# Patient Record
Sex: Female | Born: 1970
Health system: Southern US, Community
[De-identification: ages and names within clinical notes are randomized; demographics above are authoritative.]

## PROBLEM LIST (undated history)

## (undated) DIAGNOSIS — N390 Urinary tract infection, site not specified: Secondary | ICD-10-CM

## (undated) DIAGNOSIS — J40 Bronchitis, not specified as acute or chronic: Secondary | ICD-10-CM

## (undated) DIAGNOSIS — F419 Anxiety disorder, unspecified: Secondary | ICD-10-CM

## (undated) DIAGNOSIS — J302 Other seasonal allergic rhinitis: Secondary | ICD-10-CM

## (undated) DIAGNOSIS — E785 Hyperlipidemia, unspecified: Secondary | ICD-10-CM

## (undated) DIAGNOSIS — D649 Anemia, unspecified: Secondary | ICD-10-CM

## (undated) HISTORY — PX: COLONOSCOPY: SHX174

## (undated) HISTORY — PX: WISDOM TOOTH EXTRACTION: SHX21

## (undated) HISTORY — PX: UPPER GI ENDOSCOPY: SHX6162

## (undated) HISTORY — PX: DILATION AND CURETTAGE OF UTERUS: SHX78

---

## 1997-11-19 ENCOUNTER — Other Ambulatory Visit: Admission: RE | Admit: 1997-11-19 | Discharge: 1997-11-19 | Payer: Self-pay | Admitting: Obstetrics and Gynecology

## 1998-04-30 ENCOUNTER — Other Ambulatory Visit: Admission: RE | Admit: 1998-04-30 | Discharge: 1998-04-30 | Payer: Self-pay | Admitting: Obstetrics and Gynecology

## 1998-12-10 ENCOUNTER — Other Ambulatory Visit: Admission: RE | Admit: 1998-12-10 | Discharge: 1998-12-10 | Payer: Self-pay | Admitting: Obstetrics and Gynecology

## 1999-05-06 ENCOUNTER — Other Ambulatory Visit: Admission: RE | Admit: 1999-05-06 | Discharge: 1999-05-06 | Payer: Self-pay | Admitting: Obstetrics and Gynecology

## 2000-05-11 ENCOUNTER — Other Ambulatory Visit: Admission: RE | Admit: 2000-05-11 | Discharge: 2000-05-11 | Payer: Self-pay | Admitting: Obstetrics and Gynecology

## 2000-05-16 ENCOUNTER — Encounter: Payer: Self-pay | Admitting: Obstetrics and Gynecology

## 2000-05-16 ENCOUNTER — Encounter: Admission: RE | Admit: 2000-05-16 | Discharge: 2000-05-16 | Payer: Self-pay | Admitting: Obstetrics and Gynecology

## 2001-05-25 ENCOUNTER — Other Ambulatory Visit: Admission: RE | Admit: 2001-05-25 | Discharge: 2001-05-25 | Payer: Self-pay | Admitting: Obstetrics and Gynecology

## 2001-05-25 ENCOUNTER — Other Ambulatory Visit: Admission: RE | Admit: 2001-05-25 | Discharge: 2001-05-25 | Payer: Self-pay | Admitting: Obstetrics & Gynecology

## 2002-01-05 ENCOUNTER — Inpatient Hospital Stay (HOSPITAL_COMMUNITY): Admission: AD | Admit: 2002-01-05 | Discharge: 2002-01-09 | Payer: Self-pay | Admitting: Obstetrics & Gynecology

## 2002-01-06 ENCOUNTER — Encounter (INDEPENDENT_AMBULATORY_CARE_PROVIDER_SITE_OTHER): Payer: Self-pay | Admitting: *Deleted

## 2002-02-07 ENCOUNTER — Other Ambulatory Visit: Admission: RE | Admit: 2002-02-07 | Discharge: 2002-02-07 | Payer: Self-pay | Admitting: Obstetrics & Gynecology

## 2002-10-23 ENCOUNTER — Encounter: Payer: Self-pay | Admitting: Obstetrics and Gynecology

## 2002-10-23 ENCOUNTER — Encounter: Admission: RE | Admit: 2002-10-23 | Discharge: 2002-10-23 | Payer: Self-pay | Admitting: Obstetrics and Gynecology

## 2003-04-08 ENCOUNTER — Ambulatory Visit (HOSPITAL_COMMUNITY): Admission: RE | Admit: 2003-04-08 | Discharge: 2003-04-08 | Payer: Self-pay | Admitting: Obstetrics & Gynecology

## 2003-04-08 ENCOUNTER — Encounter (INDEPENDENT_AMBULATORY_CARE_PROVIDER_SITE_OTHER): Payer: Self-pay | Admitting: Specialist

## 2004-04-08 ENCOUNTER — Inpatient Hospital Stay (HOSPITAL_COMMUNITY): Admission: RE | Admit: 2004-04-08 | Discharge: 2004-04-11 | Payer: Self-pay | Admitting: Obstetrics & Gynecology

## 2006-06-30 ENCOUNTER — Inpatient Hospital Stay (HOSPITAL_COMMUNITY): Admission: RE | Admit: 2006-06-30 | Discharge: 2006-07-03 | Payer: Self-pay | Admitting: Obstetrics & Gynecology

## 2007-02-22 HISTORY — PX: CHOLECYSTECTOMY: SHX55

## 2009-05-12 ENCOUNTER — Encounter: Admission: RE | Admit: 2009-05-12 | Discharge: 2009-05-12 | Payer: Self-pay | Admitting: Obstetrics & Gynecology

## 2010-06-10 ENCOUNTER — Other Ambulatory Visit: Payer: Self-pay | Admitting: Obstetrics & Gynecology

## 2010-06-10 DIAGNOSIS — Z1231 Encounter for screening mammogram for malignant neoplasm of breast: Secondary | ICD-10-CM

## 2010-06-24 ENCOUNTER — Ambulatory Visit
Admission: RE | Admit: 2010-06-24 | Discharge: 2010-06-24 | Disposition: A | Discharge status: Home / Self Care | Payer: BC Managed Care – PPO | Source: Ambulatory Visit | Attending: Obstetrics & Gynecology | Admitting: Obstetrics & Gynecology

## 2010-06-24 DIAGNOSIS — Z1231 Encounter for screening mammogram for malignant neoplasm of breast: Secondary | ICD-10-CM

## 2010-07-09 NOTE — Op Note (Signed)
NAMEALLIENE, KLUGH               ACCOUNT NO.:  [id number]   MEDICAL RECORD NO.:  [id number]          PATIENT TYPE:  INP   LOCATION:  9148                          [facility]ACILITY:  [facility]   PHYSICIAN:  Genia Del, M.D.DATE OF BIRTH:  Mar 20, 1970   DATE OF PROCEDURE:  06/30/2006  DATE OF DISCHARGE:                               OPERATIVE REPORT   PREOPERATIVE DIAGNOSIS:  [redacted] weeks gestation, previous cesarean section.   POSTOPERATIVE DIAGNOSIS:  [redacted] weeks gestation, previous cesarean section.   PROCEDURE:  Repeat low transverse cesarean section.   SURGEON:  Genia Del, M.D.   ANESTHESIOLOGIST:  Quillian Quince, M.D.   ASSISTANT:  Marlinda Mike, C.N.M.   DESCRIPTION OF PROCEDURE:  Under spinal anesthesia, the patient is in  the 15 degrees left decubitus position.  She is prepped with Betadine on  the abdominal, suprapubic, vulvar and vaginal areas.  The bladder is  catheterized and the Foley is left in place. The patient is draped as  usual.  We verified the level of anesthesia which is adequate.  We  infiltrate with 0.25% Marcaine 15 mL at the site of the previous C-  section scar.  We then make a Pfannenstiel incision at that level with  the scalpel.  We open the adipose tissue with the scalpel.  We opened  the aponeurosis transversely with the electrocautery and Mayo scissors.  We separate the recti muscles from the aponeurosis on the midline and we  opened the parietal peritoneum longitudinally with Metzenbaum scissors.  We then put the bladder retractor in place.  We opened the visceral  peritoneum over the lower uterine segment transversely with Metzenbaum  scissors.  We reclined the bladder downward.  We then make a low  transverse hysterotomy with a scalpel.  We extend on each side with  dressing scissors.  The fetus is in cephalic presentation.  Amniotic  fluid is clear.  Birth of a baby boy at 6:35 p.m. The cord is clamped  and cut. The baby was suctioned and given  to the neonatal team.  Apgars  are 9 and 9, the weight is 7 pounds 12 ounces. The placenta is evacuated  spontaneously as the patient is a cord blood donor.  The uterine  revision is done. Pitocin is started in the IV fluid.  A dose of Ancef 1  gram IV is given.  We close the hysterotomy in a first locked running  suture of Vicryl 0, a second layer in a mattress fashion is done with  Vicryl 0, and two figure-of-eight with Vicryl 0 are used in the midline  to complete hemostasis.  Both ovaries and both tubes are normal to  inspection.  We then irrigate and suction the abdominopelvic cavity.  Hemostasis was completed with the electrocautery where necessary.  We  then close the aponeurosis with two half running sutures of Vicryl 0.  We then complete hemostasis at the adipose tissue with the  electrocautery and  reapproximate the skin with staples.  A dry dressing is applied.  The  count of instruments and sponges was complete x2.  The estimated blood  loss was 600 mL.  No complications occurred and the patient was brought  to the recovery room in good stable status.      Genia Del, M.D.  Electronically Signed     ML/MEDQ  D:  06/30/2006  T:  06/30/2006  Job:  [id number]

## 2010-07-09 NOTE — Op Note (Signed)
NAMELAURELLA, [name]                         ACCOUNT NO.:  [id number]   MEDICAL RECORD NO.:  [id number]                   PATIENT TYPE:  INP   LOCATION:  9130                                 [facility]   PHYSICIAN:  Genia Del, M.D.             DATE OF BIRTH:  August 05, 1970   DATE OF PROCEDURE:  01/06/2002  DATE OF DISCHARGE:                                 OPERATIVE REPORT   PREOPERATIVE DIAGNOSES:  1. Forty weeks and four days' gestation.  2. Arrest of descent in second stage.  3. Probable left posterior occiput presentation.  4. Unsuccessful vacuum extraction.   POSTOPERATIVE DIAGNOSES:  1. Forty weeks and four days' gestation.  2. Arrest of descent in second stage.  3. Confirmed posterior occiput presentation.  4. Unsuccessful vacuum extraction.   PROCEDURE:  Urgent primary low transverse cesarean section.   SURGEON:  Genia Del, M.D.   ANESTHESIOLOGIST:  Burnett Corrente, M.D.   DESCRIPTION OF PROCEDURE:  Under continuous spinal, the patient is in a 15  degree left decubitus presentation.  The patient is prepped with Betadine in  the abdominal, suprapubic, and vulvar areas.  The bladder catheter is  already in place, and the patient is draped as usual.  A Pfannenstiel  incision is done with a scalpel.  We then open the aponeurosis transversely  with the Mayo scissors.  The aponeurosis is removed from the recti muscles  on the midline.  We then open the parietal peritoneum with the Metzenbaum  scissors longitudinally.  We open the visceral peritoneum over the lower  uterine segment transversely with the Metzenbaum scissors.  The bladder is  reclined downward.  We open the hysterotomy transversely over the lower  uterine segment with a scalpel and extended on each side with a scissors.  The baby is in cephalic presentation, occiput posterior.  Amniotic fluid is  clear.  Birth of a baby girl at 8:26 a.m.  The cord is clamped and cut.  The  baby is given  to the neonatal team.  Apgars are 8 and 9.  The cord blood is  taken.  The placenta is then evacuated spontaneously.  It is complete, three  vessels.  We proceed with revision of the uterine cavity.  The uterus is  contracting well with Pitocin in the IV fluids.  We then close the  hysterotomy in a first continuous locked plain with 0 Vicryl, a second plain  in a mattress fashion is done with 0 Vicryl.  Hemostasis is adequate.  Both  tubes and ovaries are normal in size and appearance.  We then verify  hemostasis in the recti muscles and on the bladder flap.  It is completed  with the electrocautery.  We irrigate and suction the abdominopelvic cavity.  We then close the aponeurosis in two half-running sutures of 0 Vicryl.  The  count of instruments and sponges is complete x2.  We complete  hemostasis on  the adipose tissue with the electrocautery and reapproximate the skin with  staples.  A dry dressing is applied.  Note that the subcutaneous  infiltration of 0.25% Marcaine 10 cc was done at the beginning of the  intervention.  Then more local was poured in  the abdominopelvic cavity to complete anesthesia.  The estimated blood loss  was 700 cc.  No complication occurred, and the patient was transferred to  recovery room in good status.  She received Ancef 1 g IV after clamping the  cord.  Her blood group is A positive.  She is Rubella-immune.                                               Genia Del, M.D.    ML/MEDQ  D:  01/06/2002  T:  01/08/2002  Job:  [id number]

## 2010-07-09 NOTE — Discharge Summary (Signed)
[name], BRASE               ACCOUNT NO.:  [id number]   MEDICAL RECORD NO.:  [id number]          PATIENT TYPE:  INP   LOCATION:  9148                          [facility]   PHYSICIAN:  Genia Del, M.D.DATE OF BIRTH:  [birth year]   DATE OF ADMISSION:  06/30/2006  DATE OF DISCHARGE:  07/03/2006                               DISCHARGE SUMMARY   The patient came for surgery on Jun 30, 2006.   PREOPERATIVE DIAGNOSIS:  Thirty-nine plus weeks, previous cesarean  section.   POSTOPERATIVE DIAGNOSIS:  Thirty-nine plus weeks, previous cesarean  section.   PROCEDURE:  Repeat low transverse Cesarean section.   SURGEON:  Genia Del, M.D.   ASSISTANT:  Marlinda Mike, C.N.M.   Birth of a baby boy at 6:35, 7 pounds and 12 ounces. The estimated blood  loss was 600 mL. No complications occurred. Her postoperative evaluation  was normal. She was discharged home on postoperative day #3 in good  stable status. Her hemoglobin postoperative was 11.4. She was given  postoperative advice and pain medications. She will follow up at  [facility] in 6 weeks.      Genia Del, M.D.  Electronically Signed     ML/MEDQ  D:  08/15/2006  T:  08/15/2006  Job:  [id number]

## 2010-07-09 NOTE — Op Note (Signed)
[name], [name]               ACCOUNT NO.:  [id number]   MEDICAL RECORD NO.:  [id number]          PATIENT TYPE:  INP   LOCATION:  9199                          [facility]   PHYSICIAN:  Genia Del, M.D.DATE OF BIRTH:  1970-03-11   DATE OF PROCEDURE:  04/08/2004  DATE OF DISCHARGE:                                 OPERATIVE REPORT   PREOPERATIVE DIAGNOSES:  1.  Thirty-nine weeks' gestation.  2.  Previous cesarean section.   POSTOPERATIVE DIAGNOSES:  1.  Thirty-nine weeks' gestation.  2.  Previous cesarean section.   INTERVENTION:  Repeat elective low transverse cesarean section.   SURGEON:  Genia Del, M.D.   ASSISTANT:  Gerri Spore B. Earlene Plater, M.D.   ANESTHESIOLOGIST:  Octaviano Glow. Pamalee Leyden, M.D.   PROCEDURE:  Under spinal anesthesia, the patient is in 15 degree lateral  decubitus position.  She is prepped with Hibiclens on the abdominal,  suprapubic, vulvar and vaginal areas.  The bladder catheter is inserted and  the patient is draped as usual, and infiltration of Marcaine 0.25% 20 mL is  done at the subcutaneous tissue at the level of the previous scar.  We then  proceed with a scalpel to resect the old scar and produce a Pfannenstiel  opening.  We then use the electrocautery cutting mode to open the adipose  tissue and the aponeurosis transversely.  We extend the transverse incision  at the aponeurosis with Mayo scissors on each side.  We then free the recti  muscles from the aponeurosis on the midline superiorly and inferiorly.  The  parietal peritoneum is opened longitudinally with Metzenbaum scissors.  We  then insert the bladder retractor.  The visceral peritoneum is opened  transversely over the lower uterine segment with Metzenbaum scissors.  The  bladder is reclined downward.  The bladder retractor is repositioned.  We  then open a low transverse hysterotomy with a scalpel.  We extend on each  side with dressing scissors.  The amniotic fluid is clear.   The fetus is in  cephalic presentation.  Birth of a baby girl at 12:49.  The baby is  suctioned after delivery of the head.  The cord is clamped and cut.  The  baby is given to the neonatal team.  Apgars are 8 and 9.  Weight is 7 pounds  6 ounces.  The cord blood is taken.  The placenta is evacuated spontaneously  and sent to L&D.  The Pitocin is started in the IV fluids.  A dose of Ancef  1 g IV is given.  We then do the uterine revision.  The hysterotomy is  closed in one full plane locked running 0 Vicryl.  A second layer in a  mattress fashion is done, which completes the hysterotomy closure.  Hemostasis is adequate at all levels.  Both ovaries and both tubes are  normal in size and appearance.  The uterus is well-contracted and normal in  size and appearance.  We irrigate and suction the abdominal-pelvic cavity.  Hemostasis is completed with the electrocautery on the recti muscles where  necessary.  We then close  the aponeurosis with two half-running sutures of 0  Vicryl.  Hemostasis is completed at the adipose tissue with the  electrocautery, which is also used to decrease adhesions at  that level.  We then use staples to reapproximate the skin.  A dry dressing  is applied.  The count of sponges and instruments was complete x2.  The  estimated blood loss was 600 mL, no complication occurred, and the patient  was brought to recovery room in good status.      ML/MEDQ  D:  04/08/2004  T:  04/08/2004  Job:  [id number]

## 2010-07-09 NOTE — H&P (Signed)
[name], [name]                         ACCOUNT NO.:  [id number]   MEDICAL RECORD NO.:  [id number]                   PATIENT TYPE:  INP   LOCATION:  9160                                 [facility]   PHYSICIAN:  Genia Del, M.D.             DATE OF BIRTH:  03/13/1970   DATE OF ADMISSION:  01/05/2002  DATE OF DISCHARGE:                                HISTORY & PHYSICAL   HISTORY:  The patient is a 40 year old G2, P0, A1, estimated date of  delivery on 01/01/02 at 40 weeks', 4 days' gestation.  Ultrasound dates  corresponded.   REASON FOR ADMISSION:  Induction for proposed dates.   HISTORY OF PRESENT ILLNESS:  Mild irregular uterine contractions, fetal  movements positive, no fluid leak, no vaginal bleeding, no PIH symptoms.  Last ultrasound at term showed borderline large for gestational age and  polyhydramnios, cephalic presentation.   PAST MEDICAL HISTORY:  Postive for recurrent cystitis and  hypercholesterolemia.   PAST SURGICAL HISTORY:  Positive for kidney surgery and ureters.  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   OBSTETRICAL HISTORY:  In 1991, 5-6 weeks' therapeutic abortion, no  complications.   History of present pregnancy:  First trimester was normal, no vaginal  bleeding, no vomiting.  At __________, a left breast nodule was palpated. It  was stable compared to an ultrasound done 3/02 showing a cystic lesion.  A  repeat ultrasound would be done if the lesion increased in size; otherwise,  it will be followed up postpartum.  Labs in the first trimester showed  hemoglobin 14.1, platelets 381, blood group A positive, antibody negative,  toxo negative, syphilis negative, HBAG negative, HIV negative, Rubella  immune.  Urinalysis:  Trace leukocytes and positive nitrites.  Patient was  treated with Amoxil 500 t.i.d. for 10 days.  Urine culture was positive.  Test of cure was done still showing an infection so the patient was  retreated  with Macrobid this time, 100 mg b.i.d. for 7 days.  In the second  trimester, no complications, triple test was within normal limits.  Hemoglobin 12.8. An ultrasound done at 16+ weeks showed a corresponding  dating. It was done because the uterine height was higher than expected.  Amniotic fluid was within normal limits.  There was a posterior uterine  fibroid measuring 3.4 cm.  Ultrasound revealed anatomy at 20 weeks was  within normal limits, placenta anterior and normal, amniotic fluid within  normal limits, cervix 3.1 cm closed.  Myoma was stable.  In the third  trimester, the 1 hour GTT was normal.  Repeat ultrasound was done at 32+  weeks for suspicion of large for gestational age, the estimated fetal weight  was at the 80th percentile, amniotic fluid at the 85th percentile.  Cervix  was slightly shortened but it remained stable.  Group B strep was negative  at 36 weeks.  Repeat ultrasound  at 34+ weeks showed an estimated fetal  weight at the 32nd percentile and the fluid at 98th percentile.  Fetal  movements remained normal throughout pregnancy, blood pressures remained  normal.   FAMILY HISTORY:  Father deceased - leukemia.  Two maternal aunts with breast  cancer.  Paternal uncle with colon cancer. Mother with chronic hypertension.  Maternal grandfather with MI.   MEDICATIONS:  Prenatal vitamins.   ALLERGIES:  Naprosyn and Aleve.   REVIEW OF SYSTEMS:  HEENT:  Negative.  CARDIOVASCULAR:  Negative.  RESPIRATORY:  Negative.  GENITOURINARY/GASTROINTESTINAL:  Negative.  ENDOCRINE/DERMATOLOGY/NEUROLOGIC:  Negative.   PHYSICAL EXAMINATION:  VITAL SIGNS:  Blood pressure 129/77, pulses 82,  respiratory rate 20, temperature 98.2.  GENERAL:  No apparent distress.  LUNGS:  Clear bilaterally.  CARDIOVASCULAR:  Regular rate and rhythm.  ABDOMEN:  Gravida, cephalic presentation.  PELVIC:  Vaginal exam on admission:  3 cm dilated, 80% efface, vertex -1,  artificial rupture of membranes  done, clear fluids, normal.  Monitoring;  Uterine contractions were mild, about every 4-6 minutes on admission.  Monitoring of fetal heart rate showed baseline around 140 with  accelerations, no decelerations.  It was reactive.   IMPRESSION:  A G2, P0, A1 at 40 weeks', 4 days' gestation, for induction for  post-dates and borderline polyhydramnios and slightly large for gestational  age, fetal well-being reassuring, favorable cervix, group B strep negative.   PLAN:  Admit to labor & delivery, low dose Pitocin, artificial rupture of  membranes, expectant management towards probable vaginal delivery with  monitoring.                                                  Genia Del, M.D.    ML/MEDQ  D:  01/05/2002  T:  01/05/2002  Job:  [id number]

## 2010-07-09 NOTE — Discharge Summary (Signed)
[name], CHAMBERS               ACCOUNT NO.:  [id number]   MEDICAL RECORD NO.:  [id number]          PATIENT TYPE:  INP   LOCATION:  9143                          [facility]   PHYSICIAN:  Genia Del, M.D.DATE OF BIRTH:  Jul 21, 1970   DATE OF ADMISSION:  04/08/2004  DATE OF DISCHARGE:  04/11/2004                                 DISCHARGE SUMMARY   ADMISSION DIAGNOSES:  1.  Thirty-nine weeks gestation.  2.  History of C-section.   DISCHARGE DIAGNOSES:  1.  Thirty-nine weeks gestation.  2.  History of C-section.  3.  Birth of a baby girl.   INTERVENTION:  Elective repeat low transverse C-section on April 08, 2004.  Surgery was done without any complications.   Postop evolution was unremarkable.  Hemoglobin was 11.8 postop.  The patient  was discharged home in good status on postop day #3.  Postop advice was  given.  She was prescribed Percocet and Motrin p.r.n. She will follow up  [facility] in 4 weeks.      ML/MEDQ  D:  06/24/2004  T:  06/24/2004  Job:  [id number]

## 2010-07-09 NOTE — Discharge Summary (Signed)
   NAMEJASPER, [name]                         ACCOUNT NO.:  [id number]   MEDICAL RECORD NO.:  [id number]                   PATIENT TYPE:  INP   LOCATION:  9130                                 [facility]   PHYSICIAN:  Genia Del, M.D.             DATE OF BIRTH:  23-Dec-1970   DATE OF ADMISSION:  01/05/2002  DATE OF DISCHARGE:  01/09/2002                                 DISCHARGE SUMMARY   DATE OF BIRTH:  Jun 27, 1970   ADMISSION DIAGNOSES:  1. 40+ weeks gestation.  2. Borderline polyhydramnia.  3. Large for gestational age.   DISCHARGE DIAGNOSES:  1. 40+ weeks gestation.  2. Borderline polyhydramnia.  3. Large for gestational age.  4. Failure to descend in second stage.  5. Birth of a baby girl by C-section in occiput posterior presentation.   HOSPITAL COURSE:  [name] is a 40 year old Gravida II, Para 0, Ab 1, who  was admitted at 40 weeks and 4 days gestation for induction. She was induced  with Pitocin. Had an epidural in labor. Progressed well until complete  dilatation but then after about three hours of pushing, the baby was  descending minimally to a +3 station. Was in left posterior occipital  presentation. Mild molding in caplet was present. Given a very reassuring  fetal heart rate monitoring and inadequate pelvis, the decision was made to  attempt a vacuum extraction. But after four tractions in the safety zone  with minimal descent, the decision was taken with the patient to proceed to  an urgent C-section. So she had an urgent primary low transverse C-section  on January 06, 2002. A baby girl was born in occiput posterior  presentation. Neonatal service was present in the room Apgar's were 8 and 9.  Placenta was complete. Closure of the hysterotomy was made in two planes.  Estimated blood loss was 700 cc. No complications occurred. The patient  received Ancef operatively. The postoperative progression was normal. The  patient remained stable  and afebrile. Her hemoglobin was 9.7 and hematocrit  was 28 on postoperative day one. She was given iron supplement. She was  discharged on postoperative day three in good status. Postpartum booklet was  given and postoperative advise was reviewed. The patient was prescribed  Tylenol #30 tablets as needed and will continue on her prenatal vitamins as  well as iron supplement. She will follow-up at [facility] office in  four weeks.                                               Genia Del, M.D.    ML/MEDQ  D:  02/06/2002  T:  02/07/2002  Job:  [id number]

## 2010-07-09 NOTE — Op Note (Signed)
[name], [name]                         ACCOUNT NO.:  [id number]   MEDICAL RECORD NO.:  [id number]                   PATIENT TYPE:  AMB   LOCATION:  SDC                                  [facility]   PHYSICIAN:  Genia Del, M.D.             DATE OF BIRTH:  [birth year]   DATE OF PROCEDURE:  04/08/2003  DATE OF DISCHARGE:                                 OPERATIVE REPORT   PREOPERATIVE DIAGNOSIS:  Six plus weeks' gestation, missed abortion.   POSTOPERATIVE DIAGNOSIS:  Six plus weeks' gestation, missed abortion.   INTERVENTION:  Dilatation and evacuation.   SURGEON:  Genia Del, M.D.   ANESTHESIA:  Raul Del, M.D.   DESCRIPTION OF PROCEDURE:  Under MAC analgesia, the patient is in lithotomy  position.  She is prepped with Betadine on the suprapubic, vulvar, and  vaginal areas.  The bladder is catheterized and the patient is draped as  usual.  The vaginal exam reveals a retroverted uterus about seven to eight  weeks in size, mobile, no adnexal mass.  The cervix is closed, no bleeding.  The speculum is inserted.  The anterior lip of the cervix is grasped with a  tenaculum.  The paracervical block is done with a total of 20 mL of  lidocaine 1% at 4 and 8 o'clock.  We then proceed with dilatation with Hegar  dilators up to #35 easily.  We then use a #9 curved curette to suction the  intrauterine cavity.  Products of conception are brought out corresponding  to about seven weeks.  They are sent to pathology.  We then use the sharp  curette to systematically curettage the entire intrauterine cavity, and then  we finish with the suction curette again to empty the cavity of any blood  clots or products of conception.  The uterus contracts well on the  instrument.  Hemostasis is adequate.  All instruments are removed.  The  estimated blood loss was less than 50 mL, no complication occurred, and the  patient was transferred to the recovery room in good status.   Note that her  blood group was A positive.                                               Genia Del, M.D.    ML/MEDQ  D:  04/08/2003  T:  04/08/2003  Job:  [id number]

## 2011-02-22 DIAGNOSIS — J40 Bronchitis, not specified as acute or chronic: Secondary | ICD-10-CM

## 2011-02-22 HISTORY — DX: Bronchitis, not specified as acute or chronic: J40

## 2011-07-14 ENCOUNTER — Other Ambulatory Visit (HOSPITAL_BASED_OUTPATIENT_CLINIC_OR_DEPARTMENT_OTHER): Payer: Self-pay | Admitting: Obstetrics & Gynecology

## 2011-07-14 ENCOUNTER — Other Ambulatory Visit: Payer: Self-pay | Admitting: Obstetrics & Gynecology

## 2011-07-14 DIAGNOSIS — Z1231 Encounter for screening mammogram for malignant neoplasm of breast: Secondary | ICD-10-CM

## 2011-07-20 ENCOUNTER — Ambulatory Visit (HOSPITAL_BASED_OUTPATIENT_CLINIC_OR_DEPARTMENT_OTHER): Payer: BC Managed Care – PPO

## 2011-07-27 ENCOUNTER — Ambulatory Visit
Admission: RE | Admit: 2011-07-27 | Discharge: 2011-07-27 | Disposition: A | Discharge status: Home / Self Care | Payer: BC Managed Care – PPO | Source: Ambulatory Visit | Attending: Obstetrics & Gynecology | Admitting: Obstetrics & Gynecology

## 2011-07-27 DIAGNOSIS — Z1231 Encounter for screening mammogram for malignant neoplasm of breast: Secondary | ICD-10-CM

## 2012-02-22 DIAGNOSIS — N390 Urinary tract infection, site not specified: Secondary | ICD-10-CM

## 2012-02-22 HISTORY — DX: Urinary tract infection, site not specified: N39.0

## 2014-01-08 ENCOUNTER — Other Ambulatory Visit: Payer: Self-pay

## 2014-01-08 DIAGNOSIS — Z1231 Encounter for screening mammogram for malignant neoplasm of breast: Secondary | ICD-10-CM

## 2014-01-31 ENCOUNTER — Ambulatory Visit
Admission: RE | Admit: 2014-01-31 | Discharge: 2014-01-31 | Disposition: A | Discharge status: Home / Self Care | Payer: BC Managed Care – PPO | Source: Ambulatory Visit

## 2014-01-31 DIAGNOSIS — Z1231 Encounter for screening mammogram for malignant neoplasm of breast: Secondary | ICD-10-CM

## 2014-02-03 ENCOUNTER — Other Ambulatory Visit: Payer: Self-pay | Admitting: Obstetrics & Gynecology

## 2014-02-03 DIAGNOSIS — R928 Other abnormal and inconclusive findings on diagnostic imaging of breast: Secondary | ICD-10-CM

## 2014-02-07 ENCOUNTER — Ambulatory Visit
Admission: RE | Admit: 2014-02-07 | Discharge: 2014-02-07 | Disposition: A | Discharge status: Home / Self Care | Payer: BC Managed Care – PPO | Source: Ambulatory Visit | Attending: Obstetrics & Gynecology | Admitting: Obstetrics & Gynecology

## 2014-02-07 DIAGNOSIS — R928 Other abnormal and inconclusive findings on diagnostic imaging of breast: Secondary | ICD-10-CM

## 2015-01-19 ENCOUNTER — Other Ambulatory Visit: Payer: Self-pay

## 2015-01-19 DIAGNOSIS — Z1231 Encounter for screening mammogram for malignant neoplasm of breast: Secondary | ICD-10-CM

## 2015-02-05 ENCOUNTER — Ambulatory Visit
Admission: RE | Admit: 2015-02-05 | Discharge: 2015-02-05 | Disposition: A | Discharge status: Home / Self Care | Payer: BLUE CROSS/BLUE SHIELD | Source: Ambulatory Visit

## 2015-02-05 DIAGNOSIS — Z1231 Encounter for screening mammogram for malignant neoplasm of breast: Secondary | ICD-10-CM

## 2016-01-22 ENCOUNTER — Other Ambulatory Visit: Payer: Self-pay | Admitting: Obstetrics & Gynecology

## 2016-01-22 DIAGNOSIS — Z1231 Encounter for screening mammogram for malignant neoplasm of breast: Secondary | ICD-10-CM

## 2016-03-04 ENCOUNTER — Ambulatory Visit
Admission: RE | Admit: 2016-03-04 | Discharge: 2016-03-04 | Disposition: A | Discharge status: Home / Self Care | Payer: BLUE CROSS/BLUE SHIELD | Source: Ambulatory Visit | Attending: Obstetrics & Gynecology | Admitting: Obstetrics & Gynecology

## 2016-03-04 DIAGNOSIS — Z1231 Encounter for screening mammogram for malignant neoplasm of breast: Secondary | ICD-10-CM | POA: Diagnosis not present

## 2016-03-07 ENCOUNTER — Other Ambulatory Visit: Payer: Self-pay | Admitting: Obstetrics & Gynecology

## 2016-03-07 DIAGNOSIS — R928 Other abnormal and inconclusive findings on diagnostic imaging of breast: Secondary | ICD-10-CM

## 2016-03-11 ENCOUNTER — Ambulatory Visit
Admission: RE | Admit: 2016-03-11 | Discharge: 2016-03-11 | Disposition: A | Discharge status: Home / Self Care | Payer: BLUE CROSS/BLUE SHIELD | Source: Ambulatory Visit | Attending: Obstetrics & Gynecology | Admitting: Obstetrics & Gynecology

## 2016-03-11 DIAGNOSIS — N6012 Diffuse cystic mastopathy of left breast: Secondary | ICD-10-CM | POA: Diagnosis not present

## 2016-03-11 DIAGNOSIS — R928 Other abnormal and inconclusive findings on diagnostic imaging of breast: Secondary | ICD-10-CM

## 2016-03-11 DIAGNOSIS — R922 Inconclusive mammogram: Secondary | ICD-10-CM | POA: Diagnosis not present

## 2017-01-17 DIAGNOSIS — H52223 Regular astigmatism, bilateral: Secondary | ICD-10-CM | POA: Diagnosis not present

## 2017-01-17 DIAGNOSIS — H524 Presbyopia: Secondary | ICD-10-CM | POA: Diagnosis not present

## 2017-03-10 ENCOUNTER — Other Ambulatory Visit: Payer: Self-pay | Admitting: Obstetrics and Gynecology

## 2017-03-10 DIAGNOSIS — Z1231 Encounter for screening mammogram for malignant neoplasm of breast: Secondary | ICD-10-CM

## 2017-04-05 ENCOUNTER — Ambulatory Visit
Admission: RE | Admit: 2017-04-05 | Discharge: 2017-04-05 | Disposition: A | Discharge status: Home / Self Care | Payer: BLUE CROSS/BLUE SHIELD | Source: Ambulatory Visit | Attending: Obstetrics and Gynecology | Admitting: Obstetrics and Gynecology

## 2017-04-05 DIAGNOSIS — Z1231 Encounter for screening mammogram for malignant neoplasm of breast: Secondary | ICD-10-CM | POA: Diagnosis not present

## 2017-05-30 DIAGNOSIS — Z0000 Encounter for general adult medical examination without abnormal findings: Secondary | ICD-10-CM | POA: Diagnosis not present

## 2017-05-30 DIAGNOSIS — Z13220 Encounter for screening for lipoid disorders: Secondary | ICD-10-CM | POA: Diagnosis not present

## 2017-05-30 DIAGNOSIS — Z131 Encounter for screening for diabetes mellitus: Secondary | ICD-10-CM | POA: Diagnosis not present

## 2017-05-30 DIAGNOSIS — Z1329 Encounter for screening for other suspected endocrine disorder: Secondary | ICD-10-CM | POA: Diagnosis not present

## 2017-05-30 DIAGNOSIS — Z01419 Encounter for gynecological examination (general) (routine) without abnormal findings: Secondary | ICD-10-CM | POA: Diagnosis not present

## 2017-05-30 DIAGNOSIS — Z6828 Body mass index (BMI) 28.0-28.9, adult: Secondary | ICD-10-CM | POA: Diagnosis not present

## 2017-05-30 DIAGNOSIS — Z1151 Encounter for screening for human papillomavirus (HPV): Secondary | ICD-10-CM | POA: Diagnosis not present

## 2017-10-03 DIAGNOSIS — R9389 Abnormal findings on diagnostic imaging of other specified body structures: Secondary | ICD-10-CM | POA: Diagnosis not present

## 2017-10-03 DIAGNOSIS — N921 Excessive and frequent menstruation with irregular cycle: Secondary | ICD-10-CM | POA: Diagnosis not present

## 2017-10-03 DIAGNOSIS — N920 Excessive and frequent menstruation with regular cycle: Secondary | ICD-10-CM | POA: Diagnosis not present

## 2017-10-17 DIAGNOSIS — N921 Excessive and frequent menstruation with irregular cycle: Secondary | ICD-10-CM | POA: Diagnosis not present

## 2017-10-17 DIAGNOSIS — Z3202 Encounter for pregnancy test, result negative: Secondary | ICD-10-CM | POA: Diagnosis not present

## 2017-10-26 ENCOUNTER — Other Ambulatory Visit: Payer: Self-pay | Admitting: Obstetrics and Gynecology

## 2017-10-31 ENCOUNTER — Encounter (HOSPITAL_COMMUNITY): Payer: Self-pay | Admitting: *Deleted

## 2017-10-31 ENCOUNTER — Other Ambulatory Visit: Payer: Self-pay

## 2017-11-06 NOTE — Anesthesia Preprocedure Evaluation (Addendum)
Anesthesia Evaluation  Patient identified by MRN, date of birth, ID band Patient awake    Reviewed: Allergy & Precautions, NPO status , Patient's Chart, lab work & pertinent test results  Airway Mallampati: II  TM Distance: >3 FB Neck ROM: Full    Dental no notable dental hx. (+) Dental Advisory Given, Teeth Intact   Pulmonary neg pulmonary ROS,    Pulmonary exam normal breath sounds clear to auscultation       Cardiovascular Exercise Tolerance: Good negative cardio ROS Normal cardiovascular exam Rhythm:Regular Rate:Normal     Neuro/Psych Anxiety negative neurological ROS  negative psych ROS   GI/Hepatic   Endo/Other    Renal/GU      Musculoskeletal   Abdominal   Peds  Hematology  (+) anemia ,   Anesthesia Other Findings   Reproductive/Obstetrics                            Anesthesia Physical Anesthesia Plan  ASA: I  Anesthesia Plan: General   Post-op Pain Management:    Induction: Intravenous  PONV Risk Score and Plan: Scopolamine patch - Pre-op, Ondansetron, Dexamethasone and Treatment may vary due to age or medical condition  Airway Management Planned: LMA  Additional Equipment:   Intra-op Plan:   Post-operative Plan: Extubation in OR  Informed Consent: I have reviewed the patients History and Physical, chart, labs and discussed the procedure including the risks, benefits and alternatives for the proposed anesthesia with the patient or authorized representative who has indicated his/her understanding and acceptance.   Dental advisory given  Plan Discussed with: CRNA  Anesthesia Plan Comments:         Anesthesia Quick Evaluation

## 2017-11-07 ENCOUNTER — Other Ambulatory Visit: Payer: Self-pay

## 2017-11-07 ENCOUNTER — Ambulatory Visit (HOSPITAL_COMMUNITY): Payer: BLUE CROSS/BLUE SHIELD | Admitting: Anesthesiology

## 2017-11-07 ENCOUNTER — Ambulatory Visit (HOSPITAL_COMMUNITY)
Admission: RE | Admit: 2017-11-07 | Discharge: 2017-11-07 | Disposition: A | Discharge status: Home / Self Care | Payer: BLUE CROSS/BLUE SHIELD | Source: Ambulatory Visit | Attending: Obstetrics and Gynecology | Admitting: Obstetrics and Gynecology

## 2017-11-07 ENCOUNTER — Encounter (HOSPITAL_COMMUNITY): Payer: Self-pay | Admitting: *Deleted

## 2017-11-07 ENCOUNTER — Encounter (HOSPITAL_COMMUNITY)
Admission: RE | Disposition: A | Discharge status: Home / Self Care | Payer: Self-pay | Source: Ambulatory Visit | Attending: Obstetrics and Gynecology

## 2017-11-07 DIAGNOSIS — Z803 Family history of malignant neoplasm of breast: Secondary | ICD-10-CM | POA: Insufficient documentation

## 2017-11-07 DIAGNOSIS — N921 Excessive and frequent menstruation with irregular cycle: Secondary | ICD-10-CM | POA: Diagnosis not present

## 2017-11-07 DIAGNOSIS — Z888 Allergy status to other drugs, medicaments and biological substances status: Secondary | ICD-10-CM | POA: Diagnosis not present

## 2017-11-07 DIAGNOSIS — Z9049 Acquired absence of other specified parts of digestive tract: Secondary | ICD-10-CM | POA: Insufficient documentation

## 2017-11-07 DIAGNOSIS — Z79899 Other long term (current) drug therapy: Secondary | ICD-10-CM | POA: Diagnosis not present

## 2017-11-07 DIAGNOSIS — N840 Polyp of corpus uteri: Secondary | ICD-10-CM | POA: Diagnosis not present

## 2017-11-07 DIAGNOSIS — E785 Hyperlipidemia, unspecified: Secondary | ICD-10-CM | POA: Diagnosis not present

## 2017-11-07 DIAGNOSIS — Z91048 Other nonmedicinal substance allergy status: Secondary | ICD-10-CM | POA: Insufficient documentation

## 2017-11-07 DIAGNOSIS — F419 Anxiety disorder, unspecified: Secondary | ICD-10-CM | POA: Insufficient documentation

## 2017-11-07 DIAGNOSIS — D649 Anemia, unspecified: Secondary | ICD-10-CM | POA: Insufficient documentation

## 2017-11-07 HISTORY — DX: Bronchitis, not specified as acute or chronic: J40

## 2017-11-07 HISTORY — DX: Hyperlipidemia, unspecified: E78.5

## 2017-11-07 HISTORY — DX: Urinary tract infection, site not specified: N39.0

## 2017-11-07 HISTORY — DX: Other seasonal allergic rhinitis: J30.2

## 2017-11-07 HISTORY — PX: DILATATION & CURETTAGE/HYSTEROSCOPY WITH MYOSURE: SHX6511

## 2017-11-07 HISTORY — DX: Anxiety disorder, unspecified: F41.9

## 2017-11-07 HISTORY — DX: Anemia, unspecified: D64.9

## 2017-11-07 LAB — CBC
HCT: 42.8 % (ref 36.0–46.0)
HEMOGLOBIN: 14.2 g/dL (ref 12.0–15.0)
MCH: 31.1 pg (ref 26.0–34.0)
MCHC: 33.2 g/dL (ref 30.0–36.0)
MCV: 93.9 fL (ref 78.0–100.0)
Platelets: 419 10*3/uL — ABNORMAL HIGH (ref 150–400)
RBC: 4.56 MIL/uL (ref 3.87–5.11)
RDW: 12.4 % (ref 11.5–15.5)
WBC: 12.5 10*3/uL — AB (ref 4.0–10.5)

## 2017-11-07 LAB — BASIC METABOLIC PANEL
ANION GAP: 11 (ref 5–15)
BUN: 12 mg/dL (ref 6–20)
CALCIUM: 8.6 mg/dL — AB (ref 8.9–10.3)
CO2: 23 mmol/L (ref 22–32)
Chloride: 104 mmol/L (ref 98–111)
Creatinine, Ser: 0.83 mg/dL (ref 0.44–1.00)
Glucose, Bld: 99 mg/dL (ref 70–99)
Potassium: 4 mmol/L (ref 3.5–5.1)
SODIUM: 138 mmol/L (ref 135–145)

## 2017-11-07 LAB — HCG, SERUM, QUALITATIVE: PREG SERUM: NEGATIVE

## 2017-11-07 SURGERY — DILATATION & CURETTAGE/HYSTEROSCOPY WITH MYOSURE
Anesthesia: General | Site: Vagina

## 2017-11-07 MED ORDER — DEXAMETHASONE SODIUM PHOSPHATE 4 MG/ML IJ SOLN
INTRAMUSCULAR | Status: DC | PRN
  Administered 2017-11-07: 4 mg via INTRAVENOUS

## 2017-11-07 MED ORDER — PROPOFOL 10 MG/ML IV BOLUS
INTRAVENOUS | Status: DC | PRN
  Administered 2017-11-07: 200 mg via INTRAVENOUS

## 2017-11-07 MED ORDER — LIDOCAINE HCL (CARDIAC) PF 100 MG/5ML IV SOSY
PREFILLED_SYRINGE | INTRAVENOUS | Status: AC
  Filled 2017-11-07: qty 5

## 2017-11-07 MED ORDER — GLYCOPYRROLATE 0.2 MG/ML IJ SOLN
INTRAMUSCULAR | Status: AC
  Filled 2017-11-07: qty 1

## 2017-11-07 MED ORDER — HYDROMORPHONE HCL 1 MG/ML IJ SOLN: 0.2500 mg | INTRAMUSCULAR | Status: DC | PRN

## 2017-11-07 MED ORDER — LIDOCAINE HCL (CARDIAC) PF 100 MG/5ML IV SOSY
PREFILLED_SYRINGE | INTRAVENOUS | Status: DC | PRN
  Administered 2017-11-07: 100 mg via INTRAVENOUS

## 2017-11-07 MED ORDER — PROPOFOL 10 MG/ML IV BOLUS
INTRAVENOUS | Status: AC
  Filled 2017-11-07: qty 20

## 2017-11-07 MED ORDER — LIDOCAINE HCL (PF) 1 % IJ SOLN
INTRAMUSCULAR | Status: DC | PRN
  Administered 2017-11-07: 7 mL

## 2017-11-07 MED ORDER — ACETAMINOPHEN 500 MG PO TABS
ORAL_TABLET | ORAL | Status: AC
  Filled 2017-11-07: qty 2

## 2017-11-07 MED ORDER — SCOPOLAMINE 1 MG/3DAYS TD PT72
MEDICATED_PATCH | TRANSDERMAL | Status: AC
  Filled 2017-11-07: qty 1

## 2017-11-07 MED ORDER — GLYCOPYRROLATE 0.2 MG/ML IJ SOLN
INTRAMUSCULAR | Status: DC | PRN
  Administered 2017-11-07: 0.2 mg via INTRAVENOUS

## 2017-11-07 MED ORDER — KETOROLAC TROMETHAMINE 30 MG/ML IJ SOLN
INTRAMUSCULAR | Status: AC
  Filled 2017-11-07: qty 1

## 2017-11-07 MED ORDER — ACETAMINOPHEN 10 MG/ML IV SOLN: 1000.0000 mg | Freq: Once | INTRAVENOUS | Status: DC | PRN

## 2017-11-07 MED ORDER — MEPERIDINE HCL 25 MG/ML IJ SOLN: 6.2500 mg | INTRAMUSCULAR | Status: DC | PRN

## 2017-11-07 MED ORDER — HYDROCODONE-ACETAMINOPHEN 5-325 MG PO TABS: 1.0000 | ORAL_TABLET | Freq: Four times a day (QID) | ORAL | 0 refills | Status: AC | PRN

## 2017-11-07 MED ORDER — KETOROLAC TROMETHAMINE 30 MG/ML IJ SOLN
INTRAMUSCULAR | Status: DC | PRN
  Administered 2017-11-07: 30 mg via INTRAVENOUS

## 2017-11-07 MED ORDER — DEXAMETHASONE SODIUM PHOSPHATE 4 MG/ML IJ SOLN
INTRAMUSCULAR | Status: AC
  Filled 2017-11-07: qty 1

## 2017-11-07 MED ORDER — ONDANSETRON HCL 4 MG/2ML IJ SOLN
INTRAMUSCULAR | Status: DC | PRN
  Administered 2017-11-07: 4 mg via INTRAVENOUS

## 2017-11-07 MED ORDER — MIDAZOLAM HCL 2 MG/2ML IJ SOLN
INTRAMUSCULAR | Status: DC | PRN
  Administered 2017-11-07: 2 mg via INTRAVENOUS

## 2017-11-07 MED ORDER — HYDROCODONE-ACETAMINOPHEN 7.5-325 MG PO TABS: 1.0000 | ORAL_TABLET | Freq: Once | ORAL | Status: DC | PRN

## 2017-11-07 MED ORDER — ONDANSETRON HCL 4 MG/2ML IJ SOLN
INTRAMUSCULAR | Status: AC
  Filled 2017-11-07: qty 2

## 2017-11-07 MED ORDER — IBUPROFEN 800 MG PO TABS: 800.0000 mg | ORAL_TABLET | Freq: Three times a day (TID) | ORAL | 0 refills | Status: AC | PRN

## 2017-11-07 MED ORDER — PROMETHAZINE HCL 25 MG/ML IJ SOLN: 6.2500 mg | INTRAMUSCULAR | Status: DC | PRN

## 2017-11-07 MED ORDER — MIDAZOLAM HCL 2 MG/2ML IJ SOLN
INTRAMUSCULAR | Status: AC
  Filled 2017-11-07: qty 2

## 2017-11-07 MED ORDER — LIDOCAINE HCL 1 % IJ SOLN
INTRAMUSCULAR | Status: AC
  Filled 2017-11-07: qty 20

## 2017-11-07 MED ORDER — LACTATED RINGERS IV SOLN
INTRAVENOUS | Status: DC
  Administered 2017-11-07 (×2): via INTRAVENOUS

## 2017-11-07 MED ORDER — SODIUM CHLORIDE 0.9 % IR SOLN
Status: DC | PRN
  Administered 2017-11-07 (×2): 3000 mL

## 2017-11-07 MED ORDER — ACETAMINOPHEN 500 MG PO TABS
1000.0000 mg | ORAL_TABLET | Freq: Once | ORAL | Status: AC
  Administered 2017-11-07: 1000 mg via ORAL

## 2017-11-07 MED ORDER — SCOPOLAMINE 1 MG/3DAYS TD PT72
1.0000 | MEDICATED_PATCH | Freq: Once | TRANSDERMAL | Status: DC
  Administered 2017-11-07: 1.5 mg via TRANSDERMAL

## 2017-11-07 SURGICAL SUPPLY — 17 items
CANISTER SUCT 3000ML PPV (MISCELLANEOUS) ×2 IMPLANT
CATH ROBINSON RED A/P 16FR (CATHETERS) ×2 IMPLANT
DEVICE MYOSURE LITE (MISCELLANEOUS) IMPLANT
DEVICE MYOSURE REACH (MISCELLANEOUS) IMPLANT
FILTER ARTHROSCOPY CONVERTOR (FILTER) ×2 IMPLANT
GLOVE BIOGEL PI IND STRL 6.5 (GLOVE) ×1 IMPLANT
GLOVE BIOGEL PI IND STRL 7.0 (GLOVE) ×1 IMPLANT
GLOVE BIOGEL PI INDICATOR 6.5 (GLOVE) ×1
GLOVE BIOGEL PI INDICATOR 7.0 (GLOVE) ×1
GLOVE ECLIPSE 6.5 STRL STRAW (GLOVE) ×2 IMPLANT
GOWN STRL REUS W/TWL LRG LVL3 (GOWN DISPOSABLE) ×4 IMPLANT
PACK VAGINAL MINOR WOMEN LF (CUSTOM PROCEDURE TRAY) ×2 IMPLANT
PAD OB MATERNITY 4.3X12.25 (PERSONAL CARE ITEMS) ×2 IMPLANT
SEAL ROD LENS SCOPE MYOSURE (ABLATOR) ×2 IMPLANT
TOWEL OR 17X24 6PK STRL BLUE (TOWEL DISPOSABLE) ×4 IMPLANT
TUBING AQUILEX INFLOW (TUBING) ×2 IMPLANT
TUBING AQUILEX OUTFLOW (TUBING) ×2 IMPLANT

## 2017-11-07 NOTE — Op Note (Signed)
Preoperative diagnosis: Menometrorrhagia, endometrial polyp  Postop diagnosis: as above.  Procedure: Hysteroscopic polypectomy, D&C Anesthesia General via LMA  Surgeon: Rhoderick MoodySusan Ignacio Lowder, MD  Assistant: none IV fluids : 1400ml Estimated blood loss: 5ml Urine output: straight catheter preop : 20ml clear urine Complications none  Condition stable  Disposition PACU  Specimen: endometrial polyp with endometrial curettings   Procedure  Indication: Menometrorhagia. Office sono noted suspected 2cm endometrial polyp. Patient was counseled on risks/ complications including infection, bleeding, damage to internal organs, she understood and agrees, gave informed written consent.  Patient was brought to the operating room with IV running. Time out was carried out.  She underwent general anesthesia via LMA without complications. She was given dorsolithotomy position. The patient was prepped and draped in standard fashion. Bladder was catheterized once. Bimanual exam revealed uterus to be anteverted and normal size. Speculum was placed and cervix was grasped with single-tooth tenaculum. Cervical block with 7 cc 1% plain lidocaine given. The uterus was sounded to 6 cm. Cervical os was dilated to 15 [place] dilator. Hysteroscope was introduced in the uterine cavity under direct visualization.  A large poylp was central in the lower uterus and the camera was able to move above the polyp and then clearly see a normal fundus and tubal ostia x2.  The myosure was then replaced output tubing. Hysteroscopic [facility] and ecc performed also. Specimen sent to path. Hysteroscope was removed.  Fluid deficit 500 cc.  All counts are correct x2. No complications. Patient was made supine dorsal anesthesia and brought to the recovery room in stable condition.  Patient will be discharged home today. Discharge meds norco #5 and ibuprofen. Follow up in 2 weeks in office. Warning signs of infection and  excessive bleeding reviewed.

## 2017-11-07 NOTE — Anesthesia Procedure Notes (Signed)
Procedure Name: LMA Insertion Date/Time: 11/07/2017 10:08 AM Performed by: Trevor IhaHouser, Stephen A, MD Pre-anesthesia Checklist: Patient identified, Patient being monitored, Emergency Drugs available, Timeout performed and Suction available Patient Re-evaluated:Patient Re-evaluated prior to induction Oxygen Delivery Method: Circle System Utilized Preoxygenation: Pre-oxygenation with 100% oxygen Induction Type: IV induction Ventilation: Mask ventilation without difficulty LMA: LMA inserted LMA Size: 4.0 Number of attempts: 1 Placement Confirmation: positive ETCO2 and breath sounds checked- equal and bilateral

## 2017-11-07 NOTE — Discharge Instructions (Signed)

## 2017-11-07 NOTE — H&P (Signed)
[name] is an 47 y.o. female G5:P3023 with h/o AUB and thickened endometrium noted on u/s.  SIS showed what appears to be 2cm endometrial polyp.  Patient here for d&c, hysteroscopy and polypectomy.  On ocps for bc.  Pertinent Gynecological History: Menses: irregular  Menstrual History:  No LMP recorded (lmp unknown).    Past Medical History:  Diagnosis Date  . Anemia    2012  . Anxiety    no meds  . Bronchitis 2013  . Hyperlipidemia    borderline - otc med qd - diet control  . Seasonal allergies   . UTI (urinary tract infection) 2014   hospitalizied    Past Surgical History:  Procedure Laterality Date  . CESAREAN SECTION  2003 2006 2008   x 3  . CHOLECYSTECTOMY  2009  . COLONOSCOPY     normal  . DILATION AND CURETTAGE OF UTERUS     mab  . UPPER GI ENDOSCOPY    . WISDOM TOOTH EXTRACTION     2 lower ext    Family History  Problem Relation Age of Onset  . Breast cancer Maternal Aunt 55  . Breast cancer Maternal Aunt 27    Social History:  reports that she has never smoked. She has never used smokeless tobacco. She reports that she drinks about 3.0 - 4.0 standard drinks of alcohol per week. She reports that she does not use drugs.  Allergies:  Allergies  Allergen Reactions  . Fentanyl Itching    Jittery  . Adhesive [Tape] Rash    Medications Prior to Admission  Medication Sig Dispense Refill Last Dose  . cetirizine (ZYRTEC) 10 MG tablet Take 10 mg by mouth daily.   11/06/2017 at Unknown time  . Coenzyme Q10-Red Yeast Rice (CO Q-10 PLUS RED YEAST RICE PO) Take 1 capsule by mouth daily.   11/06/2017 at Unknown time  . ibuprofen (ADVIL,MOTRIN) 200 MG tablet Take 600 mg by mouth every 6 (six) hours as needed for fever, headache or mild pain.   11/06/2017 at Unknown time  . Multiple Vitamin (MULTIVITAMIN WITH MINERALS) TABS tablet Take 1 tablet by mouth daily.   11/06/2017 at Unknown time  . Norethin-Eth Estrad-Fe Biphas (LO LOESTRIN FE PO) Take by mouth daily.    11/06/2017 at Unknown time  . Probiotic Product (PROBIOTIC DAILY PO) Take 1 capsule by mouth daily.   Past Week at Unknown time  . Turmeric 500 MG CAPS Take 500 mg by mouth daily.   11/06/2017 at Unknown time    ROS SOB/chest pain/ HA/ vision changes/ LE pain or swelling/ etc.  Blood pressure (!) 142/94, pulse 74, temperature 98.4 F (36.9 C), temperature source Oral, resp. rate 16, height 5\' 4"  (1.626 m), weight 77.6 kg, SpO2 99 %. Physical Exam A&O x 3 HEENT : grossly wnl Lungs : ctab CV : rrr Abdo : soft, nt, nd Extr : no edema, nt Pelvic : deferred   Results for orders placed or performed during the hospital encounter of 11/07/17 (from the past 24 hour(s))  CBC     Status: Abnormal   Collection Time: 11/07/17  8:45 AM  Result Value Ref Range   WBC 12.5 (H) 4.0 - 10.5 K/uL   RBC 4.56 3.87 - 5.11 MIL/uL   Hemoglobin 14.2 12.0 - 15.0 g/dL   HCT 16.1 09.6 - 04.5 %   MCV 93.9 78.0 - 100.0 fL   MCH 31.1 26.0 - 34.0 pg   MCHC 33.2 30.0 - 36.0 g/dL  RDW 12.4 11.5 - 15.5 %   Platelets 419 (H) 150 - 400 K/uL    No results found.  Assessment/Plan: 47 y/o G5P3 with suspected endometrial polyp. 1. Proceed to OR for planned procedure.  Consent signed and pt aware of risk of bleeding, infection, uterine perforation, risk of further surgery, risk of anesthesia, risk of blood clot.  Vick FreesSusan E Aboubacar Matsuo 11/07/2017, 9:13 AM

## 2017-11-07 NOTE — Discharge Summary (Signed)
    GYN Discharge Summary     Patient Name: [name] DOB: May 23, 1970 MRN: [id number]  Date of admission: 11/07/2017  Date of discharge: 11/07/2017  Admitting diagnosis: Endometrial Polyp Intrauterine pregnancy: Unknown     Secondary diagnosis:  Active Problems:   * No active hospital problems. *  Additional problems: none     Discharge diagnosis: s/p D&C, hysteroscopy, polypectomy                                                                                                 procedures:D&C, hysteroscopy, polypectomy with myosure  Complications: None    Physical exam  Vitals:   10/30/17 1446 11/07/17 1105  BP: (!) 142/94   Pulse: 74 74  Resp: 16 12  Temp: 98.4 F (36.9 C) 97.9 F (36.6 C)  TempSrc: Oral Oral  SpO2: 99% 100%  Weight: 77.6 kg   Height: 5\' 4"  (1.626 m)   Labs: Lab Results  Component Value Date   WBC 12.5 (H) 11/07/2017   HGB 14.2 11/07/2017   HCT 42.8 11/07/2017   MCV 93.9 11/07/2017   PLT 419 (H) 11/07/2017   CMP Latest Ref Rng & Units 11/07/2017  Glucose 70 - 99 mg/dL 99  BUN 6 - 20 mg/dL 12  Creatinine 4.090.44 - 8.111.00 mg/dL 9.140.83  Sodium 782135 - 956145 mmol/L 138  Potassium 3.5 - 5.1 mmol/L 4.0  Chloride 98 - 111 mmol/L 104  CO2 22 - 32 mmol/L 23  Calcium 8.9 - 10.3 mg/dL 2.1(H8.6(L)     After visit meds:  Allergies as of 11/07/2017      Reactions   Fentanyl Itching   Jittery   Adhesive [tape] Rash      Medication List    TAKE these medications   cetirizine 10 MG tablet Commonly known as:  ZYRTEC Take 10 mg by mouth daily.   CO Q-10 PLUS RED YEAST RICE PO Take 1 capsule by mouth daily.   HYDROcodone-acetaminophen 5-325 MG tablet Commonly known as:  NORCO/VICODIN Take 1 tablet by mouth every 6 (six) hours as needed for moderate pain or severe pain.   ibuprofen 800 MG tablet Commonly known as:  ADVIL,MOTRIN Take 1 tablet (800 mg total) by mouth every 8 (eight) hours as needed. What changed:    medication strength  how much to  take  when to take this  reasons to take this   LO LOESTRIN FE PO Take by mouth daily.   multivitamin with minerals Tabs tablet Take 1 tablet by mouth daily.   PROBIOTIC DAILY PO Take 1 capsule by mouth daily.   Turmeric 500 MG Caps Take 500 mg by mouth daily.       Diet: routine diet  Activity: Advance as tolerated. Pelvic rest for 6 weeks.   Outpatient follow up:2 weeks  11/07/2017 Vick FreesSusan E Almquist, MD

## 2017-11-07 NOTE — Transfer of Care (Signed)
Immediate Anesthesia Transfer of Care Note  Patient: [name]  Procedure(s) Performed: DILATATION & CURETTAGE/HYSTEROSCOPY WITH MYOSURE (N/A Vagina )  Patient Location: PACU  Anesthesia Type:General  Level of Consciousness: awake, alert  and oriented  Airway & Oxygen Therapy: Patient Spontanous Breathing and Patient connected to nasal cannula oxygen  Post-op Assessment: Report given to RN, Post -op Vital signs reviewed and stable and Patient moving all extremities X 4  Post vital signs: Reviewed and stable  Last Vitals:  Vitals Value Taken Time  BP 143/91 11/07/2017 11:00 AM  Temp    Pulse 77 11/07/2017 11:03 AM  Resp 17 11/07/2017 11:03 AM  SpO2 100 % 11/07/2017 11:03 AM  Vitals shown include unvalidated device data.  Last Pain:  Vitals:   10/30/17 1446  TempSrc: Oral  PainSc: 0-No pain      Patients Stated Pain Goal: 4 (10/30/17 1446)  Complications: No apparent anesthesia complications

## 2017-11-08 ENCOUNTER — Encounter (HOSPITAL_COMMUNITY): Payer: Self-pay | Admitting: Obstetrics and Gynecology

## 2017-11-08 NOTE — Anesthesia Postprocedure Evaluation (Signed)
Anesthesia Post Note  Patient: [name]  Procedure(s) Performed: DILATATION & CURETTAGE/HYSTEROSCOPY WITH MYOSURE POLYPECTOMY (N/A Vagina )     Patient location during evaluation: PACU Anesthesia Type: General Level of consciousness: awake and alert Pain management: pain level controlled Vital Signs Assessment: post-procedure vital signs reviewed and stable Respiratory status: spontaneous breathing, nonlabored ventilation, respiratory function stable and patient connected to nasal cannula oxygen Cardiovascular status: blood pressure returned to baseline and stable Postop Assessment: no apparent nausea or vomiting Anesthetic complications: no    Last Vitals:  Vitals:   11/07/17 1135 11/07/17 1204  BP:  (!) 144/79  Pulse: 75 70  Resp: (!) 23 18  Temp:    SpO2: 100% 100%    Last Pain:  Vitals:   11/07/17 1204  TempSrc:   PainSc: 0-No pain                 Shermar Friedland A Kathan Kirker     

## 2017-11-08 NOTE — Anesthesia Postprocedure Evaluation (Signed)
Anesthesia Post Note  Patient: [name]  Procedure(s) Performed: DILATATION & CURETTAGE/HYSTEROSCOPY WITH MYOSURE POLYPECTOMY (N/A Vagina )     Patient location during evaluation: PACU Anesthesia Type: General Level of consciousness: awake and alert Pain management: pain level controlled Vital Signs Assessment: post-procedure vital signs reviewed and stable Respiratory status: spontaneous breathing, nonlabored ventilation, respiratory function stable and patient connected to nasal cannula oxygen Cardiovascular status: blood pressure returned to baseline and stable Postop Assessment: no apparent nausea or vomiting Anesthetic complications: no    Last Vitals:  Vitals:   11/07/17 1135 11/07/17 1204  BP:  (!) 144/79  Pulse: 75 70  Resp: (!) 23 18  Temp:    SpO2: 100% 100%    Last Pain:  Vitals:   11/07/17 1204  TempSrc:   PainSc: 0-No pain                 Trevor IhaStephen A Houser

## 2017-11-08 NOTE — Addendum Note (Signed)
Addendum  created 11/08/17 1302 by Trevor IhaHouser, Stephen A, MD   Sign clinical note

## 2018-03-06 DIAGNOSIS — Z3041 Encounter for surveillance of contraceptive pills: Secondary | ICD-10-CM | POA: Diagnosis not present

## 2018-03-06 DIAGNOSIS — N920 Excessive and frequent menstruation with regular cycle: Secondary | ICD-10-CM | POA: Diagnosis not present

## 2018-09-07 DIAGNOSIS — N6310 Unspecified lump in the right breast, unspecified quadrant: Secondary | ICD-10-CM | POA: Diagnosis not present

## 2018-09-07 DIAGNOSIS — R030 Elevated blood-pressure reading, without diagnosis of hypertension: Secondary | ICD-10-CM | POA: Diagnosis not present

## 2018-09-07 DIAGNOSIS — Z01411 Encounter for gynecological examination (general) (routine) with abnormal findings: Secondary | ICD-10-CM | POA: Diagnosis not present

## 2018-09-14 ENCOUNTER — Other Ambulatory Visit: Payer: Self-pay | Admitting: Obstetrics and Gynecology

## 2018-09-14 DIAGNOSIS — N6310 Unspecified lump in the right breast, unspecified quadrant: Secondary | ICD-10-CM

## 2018-09-20 ENCOUNTER — Ambulatory Visit
Admission: RE | Admit: 2018-09-20 | Discharge: 2018-09-20 | Disposition: A | Discharge status: Home / Self Care | Payer: BLUE CROSS/BLUE SHIELD | Source: Ambulatory Visit | Attending: Obstetrics and Gynecology | Admitting: Obstetrics and Gynecology

## 2018-09-20 ENCOUNTER — Ambulatory Visit
Admission: RE | Admit: 2018-09-20 | Discharge: 2018-09-20 | Disposition: A | Discharge status: Home / Self Care | Payer: BC Managed Care – PPO | Source: Ambulatory Visit | Attending: Obstetrics and Gynecology | Admitting: Obstetrics and Gynecology

## 2018-09-20 ENCOUNTER — Other Ambulatory Visit: Payer: Self-pay

## 2018-09-20 DIAGNOSIS — R922 Inconclusive mammogram: Secondary | ICD-10-CM | POA: Diagnosis not present

## 2018-09-20 DIAGNOSIS — N6310 Unspecified lump in the right breast, unspecified quadrant: Secondary | ICD-10-CM

## 2018-09-20 DIAGNOSIS — N6489 Other specified disorders of breast: Secondary | ICD-10-CM | POA: Diagnosis not present

## 2018-10-24 DIAGNOSIS — Z0000 Encounter for general adult medical examination without abnormal findings: Secondary | ICD-10-CM | POA: Diagnosis not present

## 2018-11-19 DIAGNOSIS — N923 Ovulation bleeding: Secondary | ICD-10-CM | POA: Diagnosis not present

## 2018-11-20 DIAGNOSIS — R109 Unspecified abdominal pain: Secondary | ICD-10-CM | POA: Diagnosis not present

## 2018-11-20 DIAGNOSIS — Z20828 Contact with and (suspected) exposure to other viral communicable diseases: Secondary | ICD-10-CM | POA: Diagnosis not present

## 2018-11-20 DIAGNOSIS — M549 Dorsalgia, unspecified: Secondary | ICD-10-CM | POA: Diagnosis not present

## 2018-11-20 DIAGNOSIS — R509 Fever, unspecified: Secondary | ICD-10-CM | POA: Diagnosis not present

## 2018-11-20 DIAGNOSIS — R319 Hematuria, unspecified: Secondary | ICD-10-CM | POA: Diagnosis not present

## 2018-11-20 DIAGNOSIS — J189 Pneumonia, unspecified organism: Secondary | ICD-10-CM | POA: Diagnosis not present

## 2018-11-30 DIAGNOSIS — R748 Abnormal levels of other serum enzymes: Secondary | ICD-10-CM | POA: Diagnosis not present

## 2018-11-30 DIAGNOSIS — R162 Hepatomegaly with splenomegaly, not elsewhere classified: Secondary | ICD-10-CM | POA: Diagnosis not present

## 2018-12-03 DIAGNOSIS — R319 Hematuria, unspecified: Secondary | ICD-10-CM | POA: Diagnosis not present

## 2018-12-05 DIAGNOSIS — Z111 Encounter for screening for respiratory tuberculosis: Secondary | ICD-10-CM | POA: Diagnosis not present

## 2018-12-05 DIAGNOSIS — R748 Abnormal levels of other serum enzymes: Secondary | ICD-10-CM | POA: Diagnosis not present

## 2018-12-05 DIAGNOSIS — Z23 Encounter for immunization: Secondary | ICD-10-CM | POA: Diagnosis not present

## 2019-08-03 IMAGING — MG DIGITAL DIAGNOSTIC BILATERAL MAMMOGRAM WITH TOMO AND CAD
8 series · 8 of 24 positions shown · non-contrast
Comparison: Previous exam(s).

CLINICAL DATA: Patient's physician reports a lump or thickening in
the lateral/upper outer right breast.

EXAM:
DIGITAL DIAGNOSTIC BILATERAL MAMMOGRAM WITH CAD AND TOMO
ULTRASOUND RIGHT BREAST

[L MLO synth-2D]
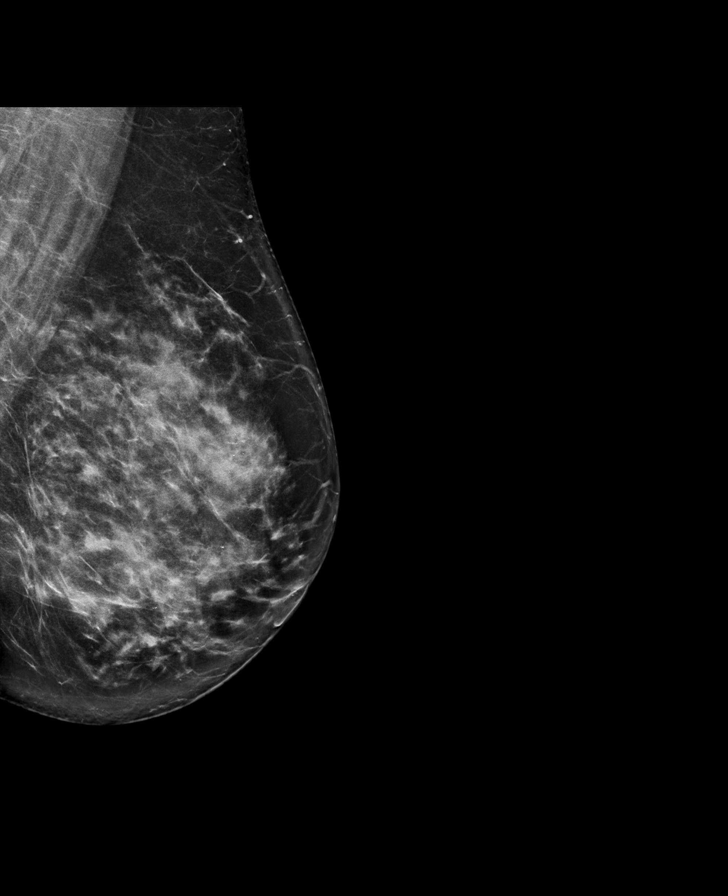

[R CC synth-2D]
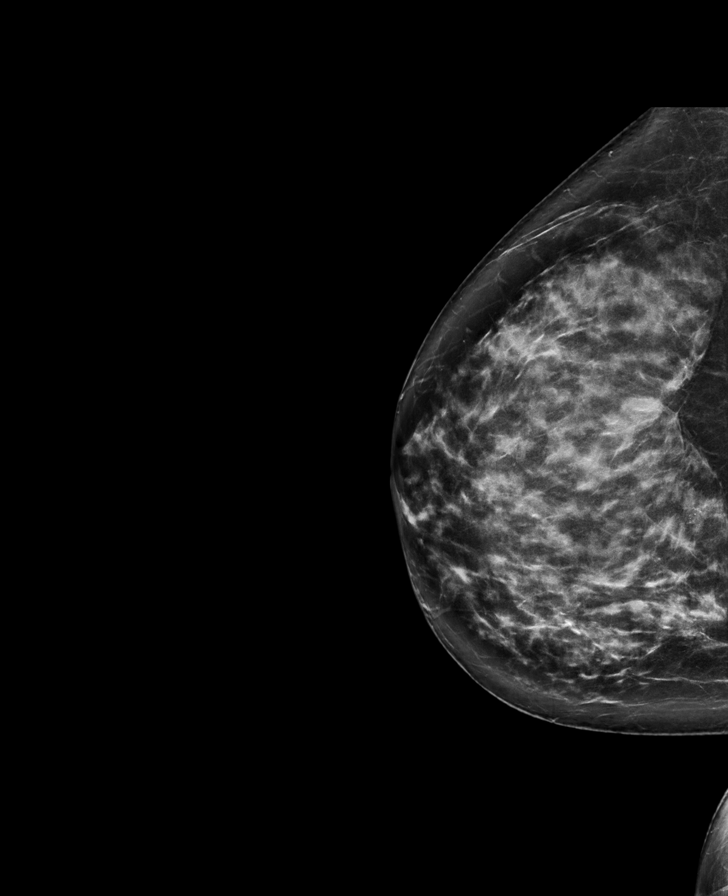

[L CC synth-2D]
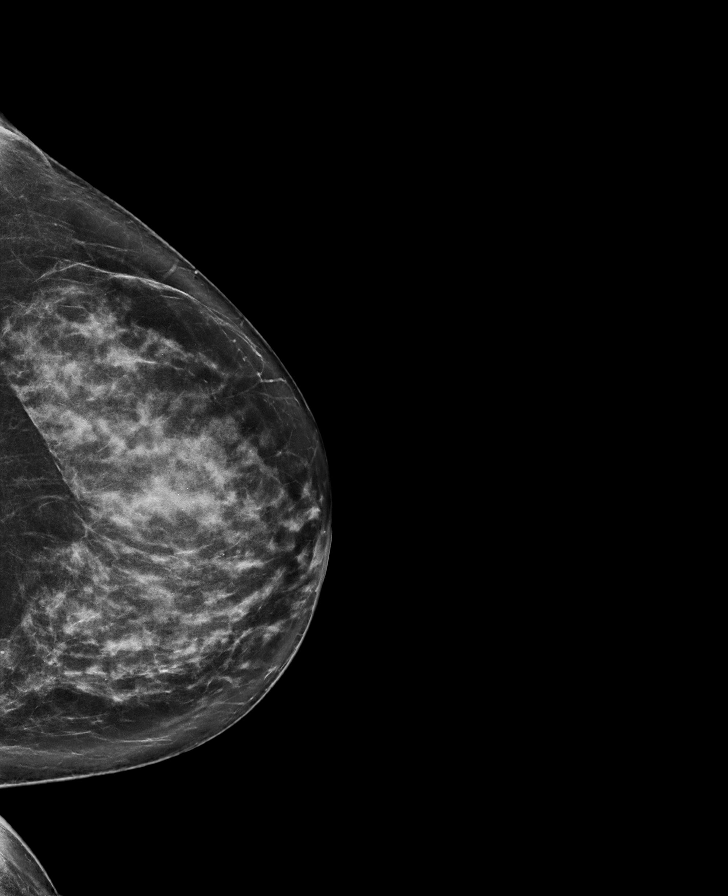

[R MLO synth-2D]
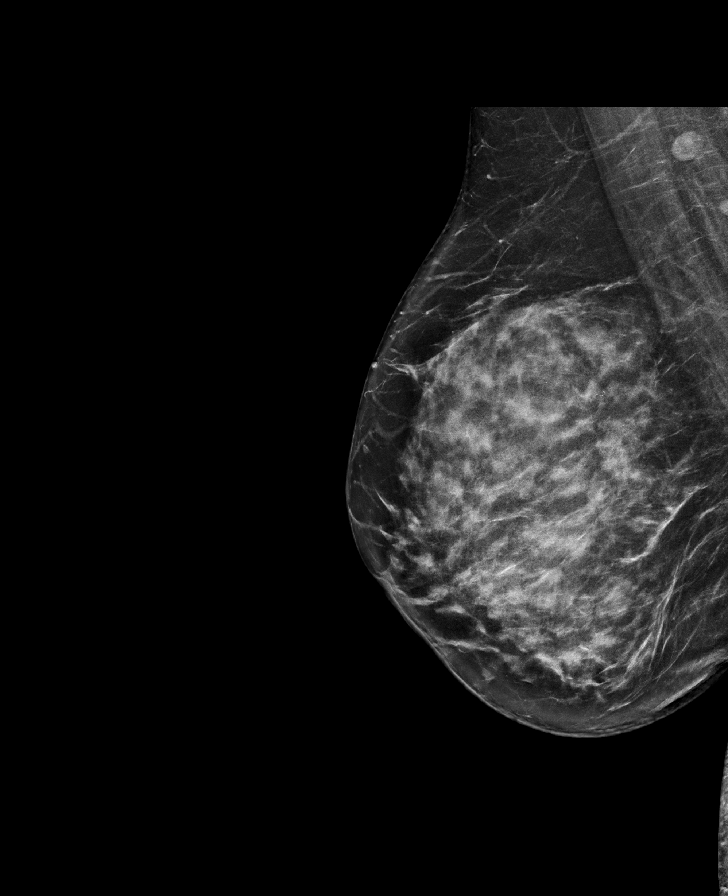

[R MLO tomo · tomo slice 43/86.0]
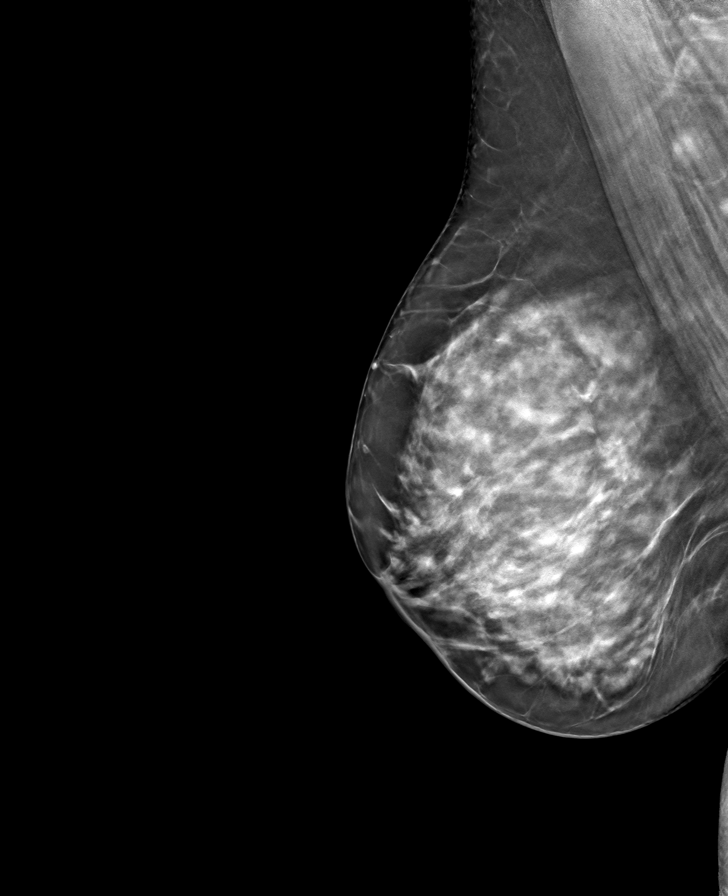

[R CC tomo · tomo slice 41/82.0]
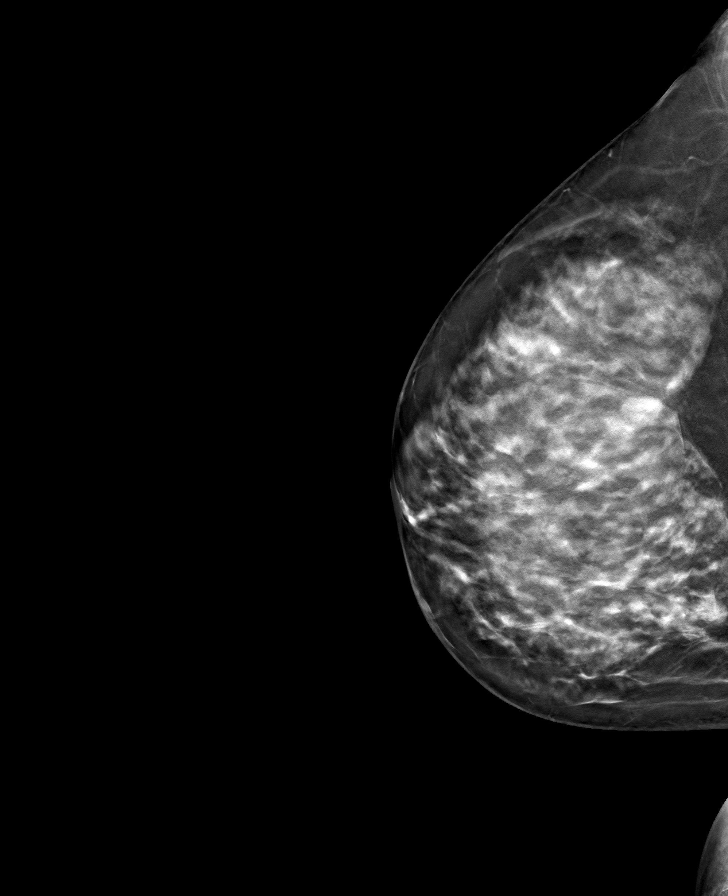

[L CC tomo · tomo slice 41/80.0]
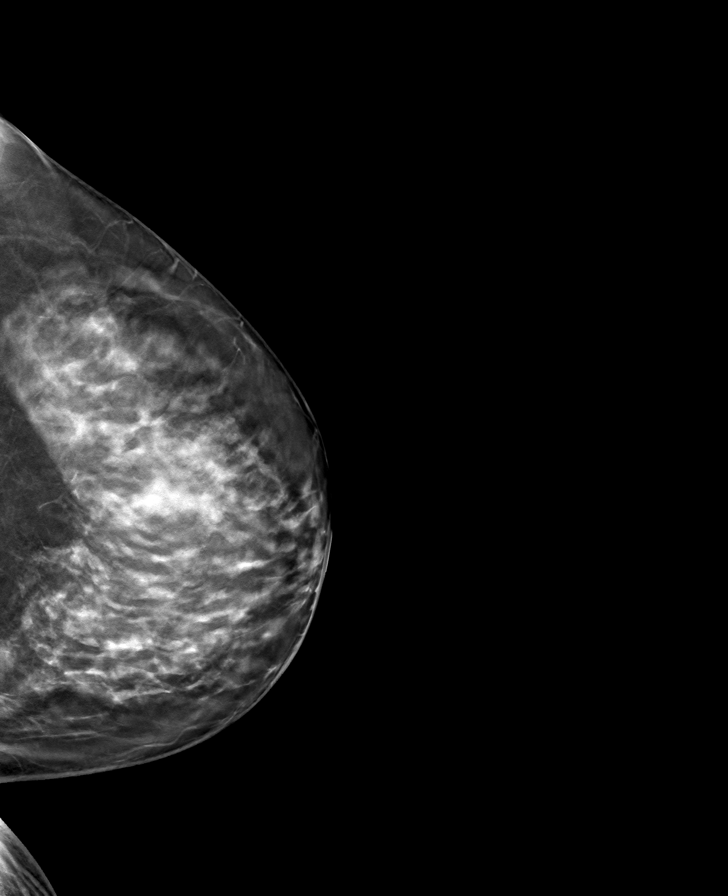

[L MLO tomo · tomo slice 43/85.0]
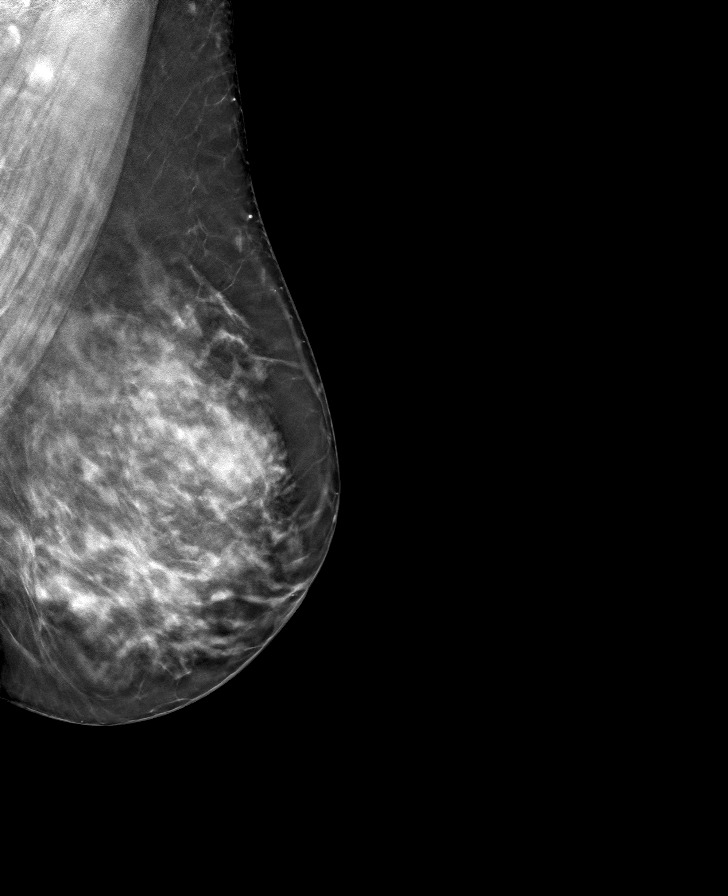

[8 of 24 positions shown; findings below may reference images not displayed]

ACR Breast Density Category d: The breast tissue is extremely dense,
which lowers the sensitivity of mammography.
FINDINGS: There are no discrete masses, areas of architectural distortion,
areas of significant asymmetry or suspicious calcifications. No
mammographic change.

Mammographic images were processed with CAD.

Targeted ultrasound is performed, showing normal tissue throughout
the lateral and upper outer right breast. No mass, cyst or
suspicious lesion.
IMPRESSION: Negative exam.  No evidence of breast malignancy.

RECOMMENDATION:
Screening mammogram in one year.(Code:G9-3-K6K)

I have discussed the findings and recommendations with the patient.
Results were also provided in writing at the conclusion of the
visit. If applicable, a reminder letter will be sent to the patient
regarding the next appointment.

BI-RADS CATEGORY  1: Negative.

## 2019-08-20 ENCOUNTER — Other Ambulatory Visit: Payer: Self-pay | Admitting: Obstetrics and Gynecology

## 2019-08-20 DIAGNOSIS — Z1231 Encounter for screening mammogram for malignant neoplasm of breast: Secondary | ICD-10-CM

## 2019-09-23 ENCOUNTER — Ambulatory Visit: Payer: BC Managed Care – PPO
